# Patient Record
Sex: Female | Born: 1985 | Race: White | Hispanic: No | Marital: Married | Smoking: Never smoker
Health system: Southern US, Community
[De-identification: ages and names within clinical notes are randomized; demographics above are authoritative.]

## PROBLEM LIST (undated history)

## (undated) DIAGNOSIS — D696 Thrombocytopenia, unspecified: Secondary | ICD-10-CM

## (undated) DIAGNOSIS — S060X9A Concussion with loss of consciousness of unspecified duration, initial encounter: Secondary | ICD-10-CM

## (undated) DIAGNOSIS — Z789 Other specified health status: Secondary | ICD-10-CM

## (undated) DIAGNOSIS — S060XAA Concussion with loss of consciousness status unknown, initial encounter: Secondary | ICD-10-CM

## (undated) DIAGNOSIS — F419 Anxiety disorder, unspecified: Secondary | ICD-10-CM

## (undated) DIAGNOSIS — O99119 Other diseases of the blood and blood-forming organs and certain disorders involving the immune mechanism complicating pregnancy, unspecified trimester: Secondary | ICD-10-CM

## (undated) HISTORY — DX: Other specified health status: Z78.9

## (undated) HISTORY — PX: WISDOM TOOTH EXTRACTION: SHX21

---

## 2011-10-19 NOTE — L&D Delivery Note (Signed)
Delivery Note At 10:03 AM a viable and healthy female was delivered via Vaginal, Spontaneous Delivery (Presentation: Left Occiput Anterior).  APGAR: 9, 9; weight 7 lb 5 oz (3317 g).   Placenta status: Intact, Spontaneous.  Cord: 3 vessels with the following complications: None.  Cord pH: na  Anesthesia: Epidural  Episiotomy: na Lacerations: 2nd degree Suture Repair: 2.0 vicryl rapide Est. Blood Loss (mL): 350  Mom to postpartum.  Baby to nursery-stable.  Akili Corsetti J 05/12/2012, 11:49 AM

## 2011-10-29 LAB — OB RESULTS CONSOLE RPR: RPR: NONREACTIVE

## 2011-10-29 LAB — OB RESULTS CONSOLE ANTIBODY SCREEN: Antibody Screen: NEGATIVE

## 2012-04-19 LAB — OB RESULTS CONSOLE GBS: GBS: NEGATIVE

## 2012-05-01 ENCOUNTER — Telehealth (HOSPITAL_COMMUNITY): Payer: Self-pay | Admitting: *Deleted

## 2012-05-01 ENCOUNTER — Encounter (HOSPITAL_COMMUNITY): Payer: Self-pay | Admitting: *Deleted

## 2012-05-01 NOTE — Telephone Encounter (Signed)
Preadmission screen  

## 2012-05-05 ENCOUNTER — Other Ambulatory Visit: Payer: Self-pay | Admitting: Obstetrics and Gynecology

## 2012-05-11 ENCOUNTER — Inpatient Hospital Stay (HOSPITAL_COMMUNITY): Admission: AD | Admit: 2012-05-11 | Payer: 59 | Source: Ambulatory Visit | Admitting: Obstetrics and Gynecology

## 2012-05-11 ENCOUNTER — Inpatient Hospital Stay (HOSPITAL_COMMUNITY)
Admission: RE | Admit: 2012-05-11 | Discharge: 2012-05-13 | DRG: 774 | Disposition: A | Discharge status: Home / Self Care | Payer: 59 | Source: Ambulatory Visit | Attending: Obstetrics and Gynecology | Admitting: Obstetrics and Gynecology

## 2012-05-11 DIAGNOSIS — D689 Coagulation defect, unspecified: Secondary | ICD-10-CM | POA: Diagnosis not present

## 2012-05-11 DIAGNOSIS — O99119 Other diseases of the blood and blood-forming organs and certain disorders involving the immune mechanism complicating pregnancy, unspecified trimester: Secondary | ICD-10-CM | POA: Diagnosis present

## 2012-05-11 DIAGNOSIS — D696 Thrombocytopenia, unspecified: Secondary | ICD-10-CM | POA: Diagnosis present

## 2012-05-11 HISTORY — DX: Other diseases of the blood and blood-forming organs and certain disorders involving the immune mechanism complicating pregnancy, unspecified trimester: O99.119

## 2012-05-11 HISTORY — DX: Thrombocytopenia, unspecified: D69.6

## 2012-05-11 LAB — CBC
Hemoglobin: 11.7 g/dL — ABNORMAL LOW (ref 12.0–15.0)
MCH: 33.1 pg (ref 26.0–34.0)
MCHC: 34.7 g/dL (ref 30.0–36.0)
MCV: 95.2 fL (ref 78.0–100.0)
RBC: 3.54 MIL/uL — ABNORMAL LOW (ref 3.87–5.11)

## 2012-05-11 MED ORDER — LACTATED RINGERS IV SOLN
500.0000 mL | INTRAVENOUS | Status: DC | PRN
  Administered 2012-05-12 (×2): 1000 mL via INTRAVENOUS

## 2012-05-11 MED ORDER — OXYTOCIN 40 UNITS IN LACTATED RINGERS INFUSION - SIMPLE MED
1.0000 m[IU]/min | INTRAVENOUS | Status: DC
  Administered 2012-05-12: 2 m[IU]/min via INTRAVENOUS
  Filled 2012-05-11: qty 1000

## 2012-05-11 MED ORDER — ZOLPIDEM TARTRATE 5 MG PO TABS: 5.0000 mg | ORAL_TABLET | Freq: Every evening | ORAL | Status: DC | PRN

## 2012-05-11 MED ORDER — LIDOCAINE HCL (PF) 1 % IJ SOLN
30.0000 mL | INTRAMUSCULAR | Status: DC | PRN
  Filled 2012-05-11: qty 30

## 2012-05-11 MED ORDER — FLEET ENEMA 7-19 GM/118ML RE ENEM: 1.0000 | ENEMA | RECTAL | Status: DC | PRN

## 2012-05-11 MED ORDER — ONDANSETRON HCL 4 MG/2ML IJ SOLN: 4.0000 mg | Freq: Four times a day (QID) | INTRAMUSCULAR | Status: DC | PRN

## 2012-05-11 MED ORDER — TERBUTALINE SULFATE 1 MG/ML IJ SOLN: 0.2500 mg | Freq: Once | INTRAMUSCULAR | Status: AC | PRN

## 2012-05-11 MED ORDER — MISOPROSTOL 25 MCG QUARTER TABLET
25.0000 ug | ORAL_TABLET | ORAL | Status: DC | PRN
  Administered 2012-05-11 – 2012-05-12 (×2): 25 ug via VAGINAL
  Filled 2012-05-11 (×2): qty 0.25

## 2012-05-11 MED ORDER — ACETAMINOPHEN 325 MG PO TABS
650.0000 mg | ORAL_TABLET | ORAL | Status: DC | PRN
  Administered 2012-05-11: 650 mg via ORAL
  Filled 2012-05-11: qty 2

## 2012-05-11 MED ORDER — OXYTOCIN 40 UNITS IN LACTATED RINGERS INFUSION - SIMPLE MED
62.5000 mL/h | Freq: Once | INTRAVENOUS | Status: AC
  Administered 2012-05-12: 200 mL/h via INTRAVENOUS

## 2012-05-11 MED ORDER — IBUPROFEN 600 MG PO TABS: 600.0000 mg | ORAL_TABLET | Freq: Four times a day (QID) | ORAL | Status: DC | PRN

## 2012-05-11 MED ORDER — OXYCODONE-ACETAMINOPHEN 5-325 MG PO TABS: 1.0000 | ORAL_TABLET | ORAL | Status: DC | PRN

## 2012-05-11 MED ORDER — LACTATED RINGERS IV SOLN
INTRAVENOUS | Status: DC
  Administered 2012-05-12: 04:00:00 via INTRAVENOUS

## 2012-05-11 MED ORDER — OXYTOCIN BOLUS FROM INFUSION
250.0000 mL | Freq: Once | INTRAVENOUS | Status: DC
  Filled 2012-05-11: qty 500

## 2012-05-11 MED ORDER — CITRIC ACID-SODIUM CITRATE 334-500 MG/5ML PO SOLN: 30.0000 mL | ORAL | Status: DC | PRN

## 2012-05-12 ENCOUNTER — Encounter (HOSPITAL_COMMUNITY): Payer: Self-pay | Admitting: Anesthesiology

## 2012-05-12 ENCOUNTER — Inpatient Hospital Stay (HOSPITAL_COMMUNITY): Payer: 59 | Admitting: Anesthesiology

## 2012-05-12 ENCOUNTER — Encounter (HOSPITAL_COMMUNITY): Payer: Self-pay

## 2012-05-12 LAB — RPR: RPR Ser Ql: NONREACTIVE

## 2012-05-12 MED ORDER — DIPHENHYDRAMINE HCL 50 MG/ML IJ SOLN
12.5000 mg | INTRAMUSCULAR | Status: DC | PRN
Start: 2012-05-12 — End: 2012-05-12

## 2012-05-12 MED ORDER — FENTANYL 2.5 MCG/ML BUPIVACAINE 1/10 % EPIDURAL INFUSION (WH - ANES): 14.0000 mL/h | INTRAMUSCULAR | Status: DC

## 2012-05-12 MED ORDER — FENTANYL 2.5 MCG/ML BUPIVACAINE 1/10 % EPIDURAL INFUSION (WH - ANES)
14.0000 mL/h | INTRAMUSCULAR | Status: DC
  Administered 2012-05-12: 14 mL/h via EPIDURAL
  Filled 2012-05-12: qty 60

## 2012-05-12 MED ORDER — SIMETHICONE 80 MG PO CHEW: 80.0000 mg | CHEWABLE_TABLET | ORAL | Status: DC | PRN

## 2012-05-12 MED ORDER — PHENYLEPHRINE 40 MCG/ML (10ML) SYRINGE FOR IV PUSH (FOR BLOOD PRESSURE SUPPORT)
80.0000 ug | PREFILLED_SYRINGE | INTRAVENOUS | Status: DC | PRN
  Filled 2012-05-12: qty 5

## 2012-05-12 MED ORDER — EPHEDRINE 5 MG/ML INJ: 10.0000 mg | INTRAVENOUS | Status: DC | PRN

## 2012-05-12 MED ORDER — DIBUCAINE 1 % RE OINT: 1.0000 "application " | TOPICAL_OINTMENT | RECTAL | Status: DC | PRN

## 2012-05-12 MED ORDER — TETANUS-DIPHTH-ACELL PERTUSSIS 5-2.5-18.5 LF-MCG/0.5 IM SUSP
0.5000 mL | Freq: Once | INTRAMUSCULAR | Status: AC
  Administered 2012-05-13: 0.5 mL via INTRAMUSCULAR
  Filled 2012-05-12: qty 0.5

## 2012-05-12 MED ORDER — LACTATED RINGERS IV SOLN
500.0000 mL | Freq: Once | INTRAVENOUS | Status: DC
Start: 2012-05-12 — End: 2012-05-12

## 2012-05-12 MED ORDER — METHYLERGONOVINE MALEATE 0.2 MG/ML IJ SOLN: 0.2000 mg | INTRAMUSCULAR | Status: DC | PRN

## 2012-05-12 MED ORDER — BENZOCAINE-MENTHOL 20-0.5 % EX AERO
1.0000 "application " | INHALATION_SPRAY | CUTANEOUS | Status: DC | PRN
  Administered 2012-05-12: 1 via TOPICAL
  Filled 2012-05-12: qty 56

## 2012-05-12 MED ORDER — DIPHENHYDRAMINE HCL 25 MG PO CAPS: 25.0000 mg | ORAL_CAPSULE | Freq: Four times a day (QID) | ORAL | Status: DC | PRN

## 2012-05-12 MED ORDER — PHENYLEPHRINE 40 MCG/ML (10ML) SYRINGE FOR IV PUSH (FOR BLOOD PRESSURE SUPPORT): 80.0000 ug | PREFILLED_SYRINGE | INTRAVENOUS | Status: DC | PRN

## 2012-05-12 MED ORDER — ONDANSETRON HCL 4 MG PO TABS: 4.0000 mg | ORAL_TABLET | ORAL | Status: DC | PRN

## 2012-05-12 MED ORDER — PRENATAL MULTIVITAMIN CH
1.0000 | ORAL_TABLET | Freq: Every day | ORAL | Status: DC
  Administered 2012-05-13: 1 via ORAL
  Filled 2012-05-12: qty 1

## 2012-05-12 MED ORDER — ZOLPIDEM TARTRATE 5 MG PO TABS: 5.0000 mg | ORAL_TABLET | Freq: Every evening | ORAL | Status: DC | PRN

## 2012-05-12 MED ORDER — DIPHENHYDRAMINE HCL 50 MG/ML IJ SOLN: 12.5000 mg | INTRAMUSCULAR | Status: DC | PRN

## 2012-05-12 MED ORDER — ONDANSETRON HCL 4 MG/2ML IJ SOLN: 4.0000 mg | INTRAMUSCULAR | Status: DC | PRN

## 2012-05-12 MED ORDER — EPHEDRINE 5 MG/ML INJ
10.0000 mg | INTRAVENOUS | Status: DC | PRN
  Filled 2012-05-12 (×2): qty 4

## 2012-05-12 MED ORDER — LACTATED RINGERS IV SOLN: 500.0000 mL | Freq: Once | INTRAVENOUS | Status: DC

## 2012-05-12 MED ORDER — LIDOCAINE HCL (PF) 1 % IJ SOLN
INTRAMUSCULAR | Status: DC | PRN
  Administered 2012-05-12 (×3): 4 mL

## 2012-05-12 MED ORDER — SENNOSIDES-DOCUSATE SODIUM 8.6-50 MG PO TABS
2.0000 | ORAL_TABLET | Freq: Every day | ORAL | Status: DC
  Administered 2012-05-12: 2 via ORAL

## 2012-05-12 MED ORDER — WITCH HAZEL-GLYCERIN EX PADS: 1.0000 "application " | MEDICATED_PAD | CUTANEOUS | Status: DC | PRN

## 2012-05-12 MED ORDER — OXYCODONE-ACETAMINOPHEN 5-325 MG PO TABS: 1.0000 | ORAL_TABLET | ORAL | Status: DC | PRN

## 2012-05-12 MED ORDER — METHYLERGONOVINE MALEATE 0.2 MG PO TABS: 0.2000 mg | ORAL_TABLET | ORAL | Status: DC | PRN

## 2012-05-12 MED ORDER — IBUPROFEN 600 MG PO TABS
600.0000 mg | ORAL_TABLET | Freq: Four times a day (QID) | ORAL | Status: DC
  Administered 2012-05-12 – 2012-05-13 (×4): 600 mg via ORAL
  Filled 2012-05-12 (×4): qty 1

## 2012-05-12 MED ORDER — LANOLIN HYDROUS EX OINT: TOPICAL_OINTMENT | CUTANEOUS | Status: DC | PRN

## 2012-05-12 NOTE — Anesthesia Preprocedure Evaluation (Signed)
Anesthesia Evaluation  Patient identified by MRN, date of birth, ID band Patient awake    Reviewed: Allergy & Precautions, H&P , NPO status , Patient's Chart, lab work & pertinent test results, reviewed documented beta blocker date and time   History of Anesthesia Complications Negative for: history of anesthetic complications  Airway Mallampati: I TM Distance: >3 FB Neck ROM: full    Dental  (+) Teeth Intact   Pulmonary neg pulmonary ROS,  breath sounds clear to auscultation        Cardiovascular negative cardio ROS  - dysrhythmias Rhythm:regular Rate:Normal     Neuro/Psych negative neurological ROS  negative psych ROS   GI/Hepatic negative GI ROS, Neg liver ROS,   Endo/Other  negative endocrine ROS  Renal/GU negative Renal ROS     Musculoskeletal   Abdominal   Peds  Hematology negative hematology ROS (+)   Anesthesia Other Findings   Reproductive/Obstetrics (+) Pregnancy                           Anesthesia Physical Anesthesia Plan  ASA: II  Anesthesia Plan: Epidural   Post-op Pain Management:    Induction:   Airway Management Planned:   Additional Equipment:   Intra-op Plan:   Post-operative Plan:   Informed Consent: I have reviewed the patients History and Physical, chart, labs and discussed the procedure including the risks, benefits and alternatives for the proposed anesthesia with the patient or authorized representative who has indicated his/her understanding and acceptance.     Plan Discussed with:   Anesthesia Plan Comments:         Anesthesia Quick Evaluation  

## 2012-05-12 NOTE — Progress Notes (Signed)
Tammie Nichols is a 26 y.o. [phone] at [redacted]w[redacted]d by LMP admitted for induction of labor due to history of abruptio.  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

## 2012-05-12 NOTE — Anesthesia Procedure Notes (Signed)
Epidural Patient location during procedure: OB Start time: 05/12/2012 8:05 AM Reason for block: procedure for pain  Staffing Performed by: anesthesiologist   Preanesthetic Checklist Completed: patient identified, site marked, surgical consent, pre-op evaluation, timeout performed, IV checked, risks and benefits discussed and monitors and equipment checked  Epidural Patient position: sitting Prep: site prepped and draped and DuraPrep Patient monitoring: continuous pulse ox and blood pressure Approach: midline Injection technique: LOR air  Needle:  Needle type: Tuohy  Needle gauge: 17 G Needle length: 9 cm Needle insertion depth: 5 cm cm Catheter type: closed end flexible Catheter size: 19 Gauge Catheter at skin depth: 10 cm Test dose: negative  Assessment Events: blood not aspirated, injection not painful, no injection resistance, negative IV test and no paresthesia  Additional Notes Discussed risk of headache, infection, bleeding, nerve injury and failed or incomplete block.  Patient voices understanding and wishes to proceed.

## 2012-05-12 NOTE — H&P (Signed)
Tammie Nichols, [place] NO.:  [id number]  MEDICAL RECORD NO.:  [id number]  LOCATION:  9167                          FACILITY:  [facility]  PHYSICIAN:  Lenoard Aden, M.D.DATE OF BIRTH:  January 22, 1986  DATE OF ADMISSION:  05/11/2012 DATE OF DISCHARGE:                             HISTORY & PHYSICAL   CHIEF COMPLAINT:  History of placental abruption for induction at 39 weeks.  HISTORY OF PRESENT ILLNESS:  She is a 26 year old white female, G4, P1, with a history of a term vaginal delivery x1, history of a 22-week pregnancy complicated by placental abruption and IUFD, who presents now at 39 weeks for induction.  She has no known drug allergies.  MEDICATIONS:  Prenatal vitamins.  FAMILY HISTORY: 1. MS. 2. Hypertension. 3. Anxiety. 4. Breast cancer. 5. Depression. 6. Heart disease.  She is a nonsmoker, nondrinker.  She denies domestic or physical violence.  She has a history of vaginal delivery as noted, history of a 22-week abruption as noted, and a 5-week pregnancy loss.  Prenatal course has been uncomplicated.  Most recent ultrasound performed on May 10, 2012, revealed an appropriate for gestational age fetus with a normal anterior placenta and estimated fetal weight was 7-1/2 pounds.  PHYSICAL EXAMINATION:  GENERAL:  She is a well-developed, well- nourished, white female, in no acute distress. HEENT:  Normal. NECK:  Supple.  Full range of motion. LUNGS:  Clear. HEART:  Regular rate and rhythm. ABDOMEN:  Soft, gravid, nontender.  Estimated fetal weight 7-1/2 pounds. Cervix is 2 cm, thick vertex, -2. EXTREMITIES:  There are no cords. NEUROLOGIC:  Nonfocal. SKIN:  Intact.  IMPRESSION: 1. A 39-week intrauterine pregnancy. 2. History of placental abruption.  PLAN:  Proceed with Cytotec induction.  Cytotec to be placed, pending reactive NST, epidural as needed.  Pitocin in a.m., anticipate attempts at vaginal delivery.     Lenoard Aden,  M.D.     RJT/MEDQ  D:  05/11/2012  T:  05/12/2012  Job:  [id number]

## 2012-05-12 NOTE — Anesthesia Postprocedure Evaluation (Signed)
  Anesthesia Post-op Note  Patient: Tammie Nichols  Procedure(s) Performed: * No procedures listed *  Patient Location: Mother/Baby  Anesthesia Type: Epidural  Level of Consciousness: awake, alert , oriented and patient cooperative  Airway and Oxygen Therapy: Patient Spontanous Breathing  Post-op Pain: mild  Post-op Assessment: Post-op Vital signs reviewed  Post-op Vital Signs: Reviewed and stable  Complications: No apparent anesthesia complications

## 2012-05-13 ENCOUNTER — Encounter (HOSPITAL_COMMUNITY): Payer: Self-pay

## 2012-05-13 DIAGNOSIS — D696 Thrombocytopenia, unspecified: Secondary | ICD-10-CM

## 2012-05-13 HISTORY — DX: Thrombocytopenia, unspecified: D69.6

## 2012-05-13 LAB — CBC
HCT: 33.3 % — ABNORMAL LOW (ref 36.0–46.0)
Hemoglobin: 11.5 g/dL — ABNORMAL LOW (ref 12.0–15.0)
MCH: 33.2 pg (ref 26.0–34.0)
MCV: 96.2 fL (ref 78.0–100.0)
RBC: 3.46 MIL/uL — ABNORMAL LOW (ref 3.87–5.11)

## 2012-05-13 MED ORDER — IBUPROFEN 600 MG PO TABS: 600.0000 mg | ORAL_TABLET | Freq: Four times a day (QID) | ORAL | Status: AC

## 2012-05-13 MED ORDER — BREAST PUMP MISC: 1.0000 [IU] | Status: DC | PRN

## 2012-05-13 NOTE — Progress Notes (Signed)
PPD 1 SVD  S:  Reports feeling well, desires early DC.             Tolerating po/ No nausea or vomiting             Bleeding is light, no clots             Pain controlled with Motrin.             Up ad lib / ambulatory / voiding well.   Newborn  Information for the patient's newborn:  Eller, Sweis [[id number]]  female  breast feeding     O:  A & O x 3 NAD             VS:  Filed Vitals:   05/12/12 1338 05/12/12 1738 05/13/12 0130 05/13/12 0525  BP: 117/61 116/68 106/62 95/60  Pulse: 93 81 70 63  Temp: 98.7 F (37.1 C) 98.6 F (37 C) 98.3 F (36.8 C) 97.5 F (36.4 C)  TempSrc: Oral Oral Oral Oral  Resp: 20 20 20 18   Height:      Weight:      SpO2: 99%  96%     LABS:  Basename 05/13/12 0525 05/11/12 2110  WBC 10.5 9.0  HGB 11.5* 11.7*  HCT 33.3* 33.7*  PLT 104* 118*    Blood type: A/Positive/-- (01/11 0000)  Rubella: Immune (01/11 0000)   I&O: I/O last 3 completed shifts: In: -  Out: 350 [Blood:350]      Lungs: Clear and unlabored  Heart: regular rate and rhythm / no murmurs  Abdomen: soft, non-tender, non-distended              Fundus: firm, non-tender, U -2  Perineum: intact repair, no bruising no edema  Lochia: scant  Extremities: no edema, no calf pain or tenderness, neg Homans    A/P: PPD # 1 26 y.o., [id number]    Principal Problem:  *Postpartum care following vaginal delivery (7/26) Active Problems:  Thrombocytopenia complicating pregnancy  Stable, no s/s PPH, no bruising    Doing well - stable status  Routine post partum orders  DC home w/ bleeding precautions.  Arlan Organ, CNM, MSN 05/13/2012, 10:14 AM

## 2012-05-13 NOTE — Discharge Summary (Signed)
Obstetric Discharge Summary Reason for Admission: induction of labor Prenatal Procedures: ultrasound Intrapartum Procedures: spontaneous vaginal delivery Postpartum Procedures: none Complications-Operative and Postpartum: 2nd degree perineal laceration Hemoglobin  Date Value Range Status  05/13/2012 11.5* 12.0 - 15.0 g/dL Final     HCT  Date Value Range Status  05/13/2012 33.3* 36.0 - 46.0 % Final    Physical Exam:  General: alert, cooperative and no distress Lochia: appropriate Uterine Fundus: firm Incision: healing well DVT Evaluation: Negative Homan's sign. No significant calf/ankle edema.  Discharge Diagnoses: Term Pregnancy-delivered and mild thrombocytopenia  Discharge Information: Date: 05/13/2012 Activity: pelvic rest Diet: routine Medications: PNV and Ibuprofen Condition: stable Instructions: refer to practice specific booklet Discharge to: home Follow-up Information    Follow up with Lenoard Aden, MD. Schedule an appointment as soon as possible for a visit in 6 weeks.   Contact information:   [address] [place] [address] [phone]          Newborn Data: Live born female "Olena Mater" Birth Weight: 7 lb 5 oz (3317 g) APGAR: 9, 9  Home with mother.  Charda Janis 05/13/2012, 10:16 AM

## 2012-05-17 ENCOUNTER — Ambulatory Visit (HOSPITAL_COMMUNITY)
Admission: RE | Admit: 2012-05-17 | Discharge: 2012-05-17 | Disposition: A | Discharge status: Home / Self Care | Payer: 59 | Source: Ambulatory Visit | Attending: Obstetrics and Gynecology | Admitting: Obstetrics and Gynecology

## 2012-05-17 NOTE — Progress Notes (Addendum)
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 Has had to use her DEBP -Medela for relief 2-3X's ( with 2oz off the left , and 1 oz off the right) . Also having challenges with accessory glands in axillary area being full ( left greater than right ). Per mom has improved with heat and pumping. Mom denies any sore nipples.  Frequency of Breastfeeding: 8-10X's in 24 hours , ( approximately every 1-21/2 hours due to the large volume of milk is producing  Length of Feeding: 5 - 10 mins ( per mom my milk lets so quickly she sometimes feeds 2 mins and comes off to take a break). With these short feedings seems very content . Last night she seemed like she was stretching her feeding times.  Voids: >8 Stools: 2-3 seedy yellow stools ( has increased to 4 )   Supplementing / Method: per mom no supplementing  Pumping:  Type of Pump:Medela DEBP -    Frequency:2-3X's   Volume:  upto 2oz , greater yield on the left than right by an 1oz.   Comments: Per mom for engorgement has been using heat , hot showers and then feeding, and feeding often   Today has been feeling some relief .    Consultation Evaluation: 1st obtained  A breastfeeding  history from mom, then Delaney woke up and  Acted hungry. Assessment of breast - both nipples appear healthy pink( no breakdown ,or soreness in appearance, breast full bilaterally ,and left areola semi  compress able due to fullness, right more compress able . Mom easily expressed milk and large am't leakage noted from both breast. Checked the axillary areas for nodules mom had mentioned( right none noted , left small nodule noted ( mom denies tenderness in this area. LC suspect its due to challenges with engorgement and will give mom tx for it.            Delaney awake and rooting. Assisted mom in a comfortable position, mom using her own boppy pillow for support and feeding in the cross cradle position left breast. Mom independent with latch and depth. Delaney sustained latch for 5-7 mins , multiply swallows and gulps of milk noted and then she released.   Initial Feeding Assessment: Pre-feed Weight: 6-14.4 oz ( 3130g)  Post-feed Weight:6-15.3 oz 3156g  Amount Transferred:26 ml plus ( plus .8 for a dirty wet diaper with the feeding)  Comments: Mom has such a quick letdown and great milk flow Delaney released after 5-7 mins and was content laying on the boppy pillow in front of  mom. Mom tried to relatch after 10-15 mins and Delaney did not seem hungry . ( Her last feeding prior to consult was @ 0900 and 0920 per mom.   Additional Feeding Assessment: Did not feed on 2nd breast mom pumped with a hand pump expressing 60 ml from the left breast . Per mom felt much more comfortable.  Pre-feed Weight: Post-feed Weight: Amount Transferred: Comments:  Additional Feeding Assessment: Pre-feed Weight: Post-feed Weight: Amount Transferred: Comments:  Total Breast milk Transferred this Visit: 26 ml plus .8 added on due to weight after changing the wet and stool diaper  Total Supplement Given: none    LC felt assured infant is getting her needs met at the breast . Mom has plenty of milk Delaney just has to get use to the volume  Additional Interventions: LACTATION PLAN OF CARE-1) Keep up the great job breast feeding 2) protect your milk supply ( when Delaney doesn't empty your breast well need to  release the breast by hand expressing or pumping to comfort ( Cautioned mom not to over pump ! ) 3) Goal is to get Delaney to the fatty milk sooner - Breast massage , hand express, prepump if needed to take off the fullness ( therefore Granville Lewis will get to the fat quicker) 1st breast feed 15-20 mins and due to milk supply may not get to the other breast ,release it down. 4) engorgement tx if needed - If breast are .full heading towards firm need to ice 15-20 mins and hand express or pre- pump for the fullness before latch. 5) For tx if swollen accessory glands cold packs or cold cabbage leaves.    Follow-Up- Next Tuesday August 6th at 11am @the  [facility] for weight check                                Per mom Friday August 9th @ [facility] for exam and weight check   Call for Breastfeeding pep talk if needed.,   Kathrin Greathouse 05/17/2012, 11:42 AM

## 2014-08-19 ENCOUNTER — Encounter (HOSPITAL_COMMUNITY): Payer: Self-pay

## 2014-09-22 DIAGNOSIS — M67919 Unspecified disorder of synovium and tendon, unspecified shoulder: Secondary | ICD-10-CM

## 2014-09-22 DIAGNOSIS — IMO0002 Reserved for concepts with insufficient information to code with codable children: Secondary | ICD-10-CM | POA: Insufficient documentation

## 2014-09-22 DIAGNOSIS — M719 Bursopathy, unspecified: Secondary | ICD-10-CM

## 2016-10-09 ENCOUNTER — Encounter: Payer: Self-pay | Admitting: Emergency Medicine

## 2016-10-09 ENCOUNTER — Emergency Department (INDEPENDENT_AMBULATORY_CARE_PROVIDER_SITE_OTHER): Payer: 59

## 2016-10-09 ENCOUNTER — Emergency Department
Admission: EM | Admit: 2016-10-09 | Discharge: 2016-10-09 | Disposition: A | Discharge status: Home / Self Care | Payer: 59 | Source: Home / Self Care | Attending: Family Medicine | Admitting: Family Medicine

## 2016-10-09 DIAGNOSIS — M79671 Pain in right foot: Secondary | ICD-10-CM

## 2016-10-09 DIAGNOSIS — S9031XA Contusion of right foot, initial encounter: Secondary | ICD-10-CM

## 2016-10-09 NOTE — ED Triage Notes (Signed)
Pt states a horse jumped on her right foot yesterday. She is having pain and swelling. Using ice and motrin.

## 2016-10-09 NOTE — ED Provider Notes (Signed)
[facility]    CSN: [id number] Arrival date & time: 10/09/16  0910     History   Chief Complaint Chief Complaint  Patient presents with  . Foot Injury    HPI Tammie Nichols is a 30 y.o. female.   Patient states that her horse stepped on her right foot yesterday twice.  She has pain in her right mid-foot.   The history is provided by the patient.  Foot Injury  Location:  Foot Time since incident:  1 day Injury: yes   Mechanism of injury: crush   Crush:    Approximate weight of object:  1200 pounds Foot location:  R foot Pain details:    Quality:  Aching   Radiates to:  Does not radiate   Severity:  Moderate   Onset quality:  Sudden   Duration:  1 day   Timing:  Constant   Progression:  Unchanged Chronicity:  New Dislocation: no   Prior injury to area:  No Relieved by:  Nothing Worsened by:  Bearing weight Ineffective treatments:  Ice and NSAIDs Associated symptoms: stiffness   Associated symptoms: no decreased ROM, no numbness, no swelling and no tingling     Past Medical History:  Diagnosis Date  . No pertinent past medical history   . Postpartum care following vaginal delivery (7/26) 05/13/2012  . Thrombocytopenia complicating pregnancy (HCC) 05/13/2012    Patient Active Problem List   Diagnosis Date Noted  . Postpartum care following vaginal delivery (7/26) 05/13/2012  . Thrombocytopenia complicating pregnancy (HCC) 05/13/2012    Past Surgical History:  Procedure Laterality Date  . NO PAST SURGERIES      OB History    Gravida Para Term Preterm AB Living   4 3 2 1 1 2    SAB TAB Ectopic Multiple Live Births   1       2       Home Medications    Prior to Admission medications   Medication Sig Start Date End Date Taking? Authorizing Provider  acetaminophen (TYLENOL) 500 MG tablet Take 1,000 mg by mouth every 6 (six) hours as needed. Head ache    Historical Provider, MD  Misc. Devices (BREAST PUMP) MISC 1 Units by Does not  apply route as needed. 05/13/12   Neta Mendsaniela C Paul, CNM  Prenatal Vit-Fe Fumarate-FA (PRENATAL MULTIVITAMIN) TABS Take 1 tablet by mouth at bedtime.    Historical Provider, MD    Family History Family History  Problem Relation Age of Onset  . Hypertension Mother   . Anxiety disorder Father   . Hypertension Father   . Depression Father   . Anxiety disorder Brother   . Depression Brother   . Heart attack Maternal Grandfather   . Heart attack Paternal Grandmother   . Cancer Cousin     breast  . Multiple sclerosis Paternal Aunt     Social History Social History  Substance Use Topics  . Smoking status: Never Smoker  . Smokeless tobacco: Never Used  . Alcohol use No     Allergies   Patient has no known allergies.   Review of Systems Review of Systems  Musculoskeletal: Positive for stiffness.  All other systems reviewed and are negative.    Physical Exam Triage Vital Signs ED Triage Vitals  Enc Vitals Group     BP      Pulse      Resp      Temp      Temp src  SpO2      Weight      Height      Head Circumference      Peak Flow      Pain Score      Pain Loc      Pain Edu?      Excl. in GC?    No data found.   Updated Vital Signs BP 105/73 (BP Location: Right Arm)   Pulse 100   Temp 98 F (36.7 C) (Oral)   Wt 135 lb (61.2 kg)   LMP 10/04/2016   SpO2 98%   BMI 23.17 kg/m   Visual Acuity Right Eye Distance:   Left Eye Distance:   Bilateral Distance:    Right Eye Near:   Left Eye Near:    Bilateral Near:     Physical Exam  Constitutional: She appears well-developed and well-nourished. No distress.  HENT:  Head: Atraumatic.  Eyes: Pupils are equal, round, and reactive to light.  Neck: Normal range of motion.  Cardiovascular: Normal rate.   Pulmonary/Chest: Effort normal.  Musculoskeletal:       Right foot: There is tenderness and bony tenderness. There is normal range of motion, no swelling, normal capillary refill, no crepitus, no  deformity and no laceration.       Feet:  Right foot has tenderness to palpation dorsally as noted on diagram.  Ankle and all toes have full range of motion.  Distal neurovascular function is intact.  There is tenderness to palpation over extensor tendons dorsally.  Neurological: She is alert.  Skin: Skin is warm and dry.  Nursing note and vitals reviewed.    UC Treatments / Results  Labs (all labs ordered are listed, but only abnormal results are displayed) Labs Reviewed - No data to display  EKG  EKG Interpretation None       Radiology Dg Foot Complete Right  Result Date: 10/09/2016 CLINICAL DATA:  Fall and stepped on by horse with right foot pain. EXAM: RIGHT FOOT COMPLETE - 3+ VIEW COMPARISON:  None. FINDINGS: There is no evidence of fracture or dislocation. There is no evidence of arthropathy or other focal bone abnormality. Soft tissues are unremarkable. IMPRESSION: Negative. Electronically Signed   By: Elberta Fortisaniel  Boyle M.D.   On: 10/09/2016 10:23    Procedures Procedures (including critical care time)  Medications Ordered in UC Medications - No data to display   Initial Impression / Assessment and Plan / UC Course  I have reviewed the triage vital signs and the nursing notes.  Pertinent labs & imaging results that were available during my care of the patient were reviewed by me and considered in my medical decision making (see chart for details).  Clinical Course   Ace wrap applied. Apply ice pack for 20 to 30 minutes, 3 to 4 times daily.  Continue until pain and swelling decrease.  Wear ace wrap to decrease swelling daytime.   May take Ibuprofen 200mg , 4 tabs every 8 hours with food. Begin range of motion and stretching exercises in 3 to 5 days as tolerated (follow instructions for foot extensor tendonitis). Followup with Dr. Rodney Langtonhomas Thekkekandam or Dr. Clementeen GrahamEvan Corey ([facility]) if not improving about two weeks.     Final Clinical Impressions(s) / UC  Diagnoses   Final diagnoses:  Contusion of right foot, initial encounter    New Prescriptions New Prescriptions   No medications on file     Lattie HawStephen A Lucella Pommier, MD 10/25/16 1007

## 2016-10-09 NOTE — Discharge Instructions (Signed)
Apply ice pack for 20 to 30 minutes, 3 to 4 times daily.  Continue until pain and swelling decrease.  Wear ace wrap to decrease swelling daytime.   May take Ibuprofen 200mg , 4 tabs every 8 hours with food. Begin range of motion and stretching exercises in 3 to 5 days as tolerated (follow instructions for foot extensor tendonitis).

## 2017-03-25 ENCOUNTER — Encounter: Payer: Self-pay | Admitting: Emergency Medicine

## 2017-03-25 ENCOUNTER — Emergency Department
Admission: EM | Admit: 2017-03-25 | Discharge: 2017-03-25 | Disposition: A | Discharge status: Home / Self Care | Payer: 59 | Source: Home / Self Care | Attending: Family Medicine | Admitting: Family Medicine

## 2017-03-25 DIAGNOSIS — R358 Other polyuria: Secondary | ICD-10-CM

## 2017-03-25 DIAGNOSIS — N3090 Cystitis, unspecified without hematuria: Secondary | ICD-10-CM

## 2017-03-25 DIAGNOSIS — R3589 Other polyuria: Secondary | ICD-10-CM

## 2017-03-25 LAB — POCT URINALYSIS DIP (MANUAL ENTRY)
Bilirubin, UA: NEGATIVE
GLUCOSE UA: NEGATIVE mg/dL
Ketones, POC UA: NEGATIVE mg/dL
Nitrite, UA: NEGATIVE
PROTEIN UA: NEGATIVE mg/dL
Spec Grav, UA: 1.01 (ref 1.010–1.025)
Urobilinogen, UA: 0.2 E.U./dL
pH, UA: 6 (ref 5.0–8.0)

## 2017-03-25 LAB — POCT URINE PREGNANCY: PREG TEST UR: NEGATIVE

## 2017-03-25 MED ORDER — NITROFURANTOIN MONOHYD MACRO 100 MG PO CAPS: 100.0000 mg | ORAL_CAPSULE | Freq: Two times a day (BID) | ORAL | 0 refills | Status: AC

## 2017-03-25 NOTE — Discharge Instructions (Signed)
Increase fluid intake. May use non-prescription AZO for about two days, if desired, to decrease urinary discomfort.  If symptoms become significantly worse during the night or over the weekend, proceed to the local emergency room.  

## 2017-03-25 NOTE — ED Provider Notes (Signed)
[facility]    CSN: [id number] Arrival date & time: 03/25/17  0933     History   Chief Complaint Chief Complaint  Patient presents with  . Hematuria    HPI Tammie Nichols is a 31 y.o. female.   Patient complains of onset of dysuria, urgency, frequency, and nocturia five days ago.  She has had chills but no fever.  Patient's last menstrual period was 03/07/2017 (exact date).    The history is provided by the patient.  Hematuria  This is a new problem. Episode onset: 5 days ago. The problem occurs constantly. The problem has not changed since onset.Pertinent negatives include no abdominal pain. Nothing aggravates the symptoms. Nothing relieves the symptoms. She has tried nothing for the symptoms.    Past Medical History:  Diagnosis Date  . No pertinent past medical history   . Postpartum care following vaginal delivery (7/26) 05/13/2012  . Thrombocytopenia complicating pregnancy (HCC) 05/13/2012    Patient Active Problem List   Diagnosis Date Noted  . Postpartum care following vaginal delivery (7/26) 05/13/2012  . Thrombocytopenia complicating pregnancy (HCC) 05/13/2012    Past Surgical History:  Procedure Laterality Date  . NO PAST SURGERIES      OB History    Gravida Para Term Preterm AB Living   4 3 2 1 1 2    SAB TAB Ectopic Multiple Live Births   1       2       Home Medications    Prior to Admission medications   Medication Sig Start Date End Date Taking? Authorizing Provider  Cranberry-Vit C-Lactobacillus (RA CRANBERRY SUPPLEMENTS) 450-30 MG TABS Take by mouth.   Yes [provider]  acetaminophen (TYLENOL) 500 MG tablet Take 1,000 mg by mouth every 6 (six) hours as needed. Head ache    [provider]  nitrofurantoin, macrocrystal-monohydrate, (MACROBID) 100 MG capsule Take 1 capsule (100 mg total) by mouth 2 (two) times daily. Take with food. 03/25/17   Lattie Haw, MD    Family History Family History  Problem  Relation Age of Onset  . Hypertension Mother   . Anxiety disorder Father   . Hypertension Father   . Depression Father   . Anxiety disorder Brother   . Depression Brother   . Heart attack Maternal Grandfather   . Heart attack Paternal Grandmother   . Cancer Cousin        breast  . Multiple sclerosis Paternal Aunt     Social History Social History  Substance Use Topics  . Smoking status: Never Smoker  . Smokeless tobacco: Never Used  . Alcohol use Yes     Allergies   Patient has no known allergies.   Review of Systems Review of Systems  Gastrointestinal: Negative for abdominal pain.  Genitourinary: Positive for hematuria.  All other systems reviewed and are negative.    Physical Exam Triage Vital Signs ED Triage Vitals [03/25/17 0959]  Enc Vitals Group     BP 109/68     Pulse Rate 86     Resp      Temp 98.8 F (37.1 C)     Temp Source Oral     SpO2 100 %     Weight 132 lb (59.9 kg)     Height 5\' 3"  (1.6 m)     Head Circumference      Peak Flow      Pain Score 4     Pain Loc  Pain Edu?      Excl. in GC?    No data found.   Updated Vital Signs BP 109/68 (BP Location: Left Arm)   Pulse 86   Temp 98.8 F (37.1 C) (Oral)   Ht 5\' 3"  (1.6 m)   Wt 132 lb (59.9 kg)   LMP 03/07/2017 (Exact Date)   SpO2 100%   BMI 23.38 kg/m   Visual Acuity Right Eye Distance:   Left Eye Distance:   Bilateral Distance:    Right Eye Near:   Left Eye Near:    Bilateral Near:     Physical Exam Nursing notes and Vital Signs reviewed. Appearance:  Patient appears stated age, and in no acute distress.    Eyes:  Pupils are equal, round, and reactive to light and accomodation.  Extraocular movement is intact.  Conjunctivae are not inflamed   Pharynx:  Normal; moist mucous membranes  Neck:  Supple.  No adenopathy Lungs:  Clear to auscultation.  Breath sounds are equal.  Moving air well. Heart:  Regular rate and rhythm without murmurs, rubs, or gallops.  Abdomen:   Nontender without masses or hepatosplenomegaly.  Bowel sounds are present.  No CVA or flank tenderness.  Extremities:  No edema.  Skin:  No rash present.     UC Treatments / Results  Labs (all labs ordered are listed, but only abnormal results are displayed) Labs Reviewed  POCT URINALYSIS DIP (MANUAL ENTRY) - Abnormal; Notable for the following:       Result Value   Clarity, UA cloudy (*)    Blood, UA small (*)    Leukocytes, UA Large (3+) (*)    All other components within normal limits  URINE CULTURE  POCT URINE PREGNANCY    EKG  EKG Interpretation None       Radiology No results found.  Procedures Procedures (including critical care time)  Medications Ordered in UC Medications - No data to display   Initial Impression / Assessment and Plan / UC Course  I have reviewed the triage vital signs and the nursing notes.  Pertinent labs & imaging results that were available during my care of the patient were reviewed by me and considered in my medical decision making (see chart for details).    Urine culture pending. Begin Macrobid 100mg  BID for one week. Increase fluid intake. May use non-prescription AZO for about two days, if desired, to decrease urinary discomfort.  If symptoms become significantly worse during the night or over the weekend, proceed to the local emergency room.  Followup with Family Doctor if not improved in one week.     Final Clinical Impressions(s) / UC Diagnoses   Final diagnoses:  Polyuria  Cystitis    New Prescriptions Discharge Medication List as of 03/25/2017 10:23 AM    START taking these medications   Details  nitrofurantoin, macrocrystal-monohydrate, (MACROBID) 100 MG capsule Take 1 capsule (100 mg total) by mouth 2 (two) times daily. Take with food., Starting Fri 03/25/2017, Normal         Lattie HawBeese, Stephen A, MD 03/25/17 [phone]

## 2017-03-25 NOTE — ED Triage Notes (Signed)
Hematuria, urgency, lower abdominal pain, fever polyuria x 5 days

## 2017-03-27 LAB — URINE CULTURE

## 2017-03-28 ENCOUNTER — Telehealth: Payer: Self-pay | Admitting: Emergency Medicine

## 2017-12-21 DIAGNOSIS — M5416 Radiculopathy, lumbar region: Secondary | ICD-10-CM | POA: Insufficient documentation

## 2018-06-21 ENCOUNTER — Emergency Department (HOSPITAL_COMMUNITY): Payer: 59

## 2018-06-21 ENCOUNTER — Encounter (HOSPITAL_COMMUNITY): Payer: Self-pay | Admitting: Emergency Medicine

## 2018-06-21 ENCOUNTER — Emergency Department (HOSPITAL_COMMUNITY)
Admission: EM | Admit: 2018-06-21 | Discharge: 2018-06-21 | Disposition: A | Discharge status: Home / Self Care | Payer: 59 | Attending: Emergency Medicine | Admitting: Emergency Medicine

## 2018-06-21 DIAGNOSIS — R55 Syncope and collapse: Secondary | ICD-10-CM | POA: Insufficient documentation

## 2018-06-21 DIAGNOSIS — F419 Anxiety disorder, unspecified: Secondary | ICD-10-CM | POA: Diagnosis not present

## 2018-06-21 DIAGNOSIS — Z79899 Other long term (current) drug therapy: Secondary | ICD-10-CM | POA: Insufficient documentation

## 2018-06-21 DIAGNOSIS — R002 Palpitations: Secondary | ICD-10-CM | POA: Insufficient documentation

## 2018-06-21 HISTORY — DX: Anxiety disorder, unspecified: F41.9

## 2018-06-21 LAB — BASIC METABOLIC PANEL
ANION GAP: 10 (ref 5–15)
BUN: 13 mg/dL (ref 6–20)
CHLORIDE: 104 mmol/L (ref 98–111)
CO2: 27 mmol/L (ref 22–32)
Calcium: 10 mg/dL (ref 8.9–10.3)
Creatinine, Ser: 0.78 mg/dL (ref 0.44–1.00)
GFR calc non Af Amer: >60 mL/min (ref >60)
Glucose, Bld: 99 mg/dL (ref 70–99)
Potassium: 3.9 mmol/L (ref 3.5–5.1)
Sodium: 141 mmol/L (ref 135–145)

## 2018-06-21 LAB — CBC
HEMATOCRIT: 40.5 % (ref 36.0–46.0)
Hemoglobin: 14.1 g/dL (ref 12.0–15.0)
MCH: 33.1 pg (ref 26.0–34.0)
MCHC: 34.8 g/dL (ref 30.0–36.0)
MCV: 95.1 fL (ref 78.0–100.0)
Platelets: 210 10*3/uL (ref 150–400)
RBC: 4.26 MIL/uL (ref 3.87–5.11)
RDW: 12 % (ref 11.5–15.5)
WBC: 8.7 10*3/uL (ref 4.0–10.5)

## 2018-06-21 LAB — I-STAT BETA HCG BLOOD, ED (MC, WL, AP ONLY): I-stat hCG, quantitative: <5 m[IU]/mL (ref <5)

## 2018-06-21 LAB — I-STAT TROPONIN, ED: Troponin i, poc: 0.01 ng/mL (ref 0.00–0.08)

## 2018-06-21 LAB — POCT I-STAT TROPONIN I: Troponin i, poc: 0.02 ng/mL (ref 0.00–0.08)

## 2018-06-21 MED ORDER — SODIUM CHLORIDE 0.9 % IV BOLUS
1000.0000 mL | Freq: Once | INTRAVENOUS | Status: AC
  Administered 2018-06-21: 1000 mL via INTRAVENOUS

## 2018-06-21 NOTE — ED Provider Notes (Signed)
[facility]-EMERGENCY DEPT Provider Note   CSN: [id number] Arrival date & time: 06/21/18  1443     History   Chief Complaint Chief Complaint  Patient presents with  . Palpitations  . Dizziness    HPI Tammie Nichols is a 32 y.o. female presenting for lightheadedness and palpitations. Patient states that she went on for her daily run today around lunchtime when she began feeling lightheaded, like she is going to pass out, as well as palpitations.  She states that this lasted for approximately 2 minutes.  Patient states that her heart rate at that time was in the 160s which is higher than usual for her.  Patient states that she sat down and rested, drank water and began feeling back to normal however when she returned to work after her run her heart rate was in the 80s which again she states is higher than it normally is for her.  Patient states that she is an otherwise healthy individual, denies history of heart disease or arrhythmias, denies similar episodes in the past, denies history of blood clots.  Patient denies history of sudden cardiac death in her family below the age of 60.  Patient denies history of recent illness, nausea/vomiting or diarrhea.  Patient states that she is feeling well now, denies palpitations or lightheadedness or any other new or concerning symptoms at this time.  HPI  Past Medical History:  Diagnosis Date  . Anxiety   . No pertinent past medical history   . Postpartum care following vaginal delivery (7/26) 05/13/2012  . Thrombocytopenia complicating pregnancy (HCC) 05/13/2012    Patient Active Problem List   Diagnosis Date Noted  . Postpartum care following vaginal delivery (7/26) 05/13/2012  . Thrombocytopenia complicating pregnancy (HCC) 05/13/2012    Past Surgical History:  Procedure Laterality Date  . NO PAST SURGERIES       OB History    Gravida  4   Para  3   Term  2   Preterm  1   AB  1   Living  2     SAB  1   TAB      Ectopic      Multiple      Live Births  2            Home Medications    Prior to Admission medications   Medication Sig Start Date End Date Taking? Authorizing Provider  ibuprofen (ADVIL,MOTRIN) 200 MG tablet Take 800 mg by mouth at bedtime as needed. Back pain   Yes [provider]  Multiple Vitamin (MULTIVITAMIN WITH MINERALS) TABS tablet Take 1 tablet by mouth daily.   Yes [provider]  sertraline (ZOLOFT) 100 MG tablet Take 100 mg by mouth daily. 06/11/18  Yes [provider]  nitrofurantoin, macrocrystal-monohydrate, (MACROBID) 100 MG capsule Take 1 capsule (100 mg total) by mouth 2 (two) times daily. Take with food. Patient not taking: Reported on 06/21/2018 03/25/17   Lattie Haw, MD    Family History Family History  Problem Relation Age of Onset  . Hypertension Mother   . Anxiety disorder Father   . Hypertension Father   . Depression Father   . Anxiety disorder Brother   . Depression Brother   . Heart attack Maternal Grandfather   . Heart attack Paternal Grandmother   . Cancer Cousin        breast  . Multiple sclerosis Paternal Aunt     Social History Social History  Tobacco Use  . Smoking status: Never Smoker  . Smokeless tobacco: Never Used  Substance Use Topics  . Alcohol use: Yes  . Drug use: No     Allergies   Patient has no known allergies.   Review of Systems Review of Systems  Constitutional: Negative.  Negative for chills and fever.  HENT: Negative.  Negative for rhinorrhea and sore throat.   Eyes: Negative.  Negative for visual disturbance.  Respiratory: Negative.  Negative for cough and shortness of breath.   Cardiovascular: Positive for palpitations. Negative for chest pain.  Gastrointestinal: Negative.  Negative for abdominal pain, blood in stool, diarrhea, nausea and vomiting.  Genitourinary: Negative.  Negative for dysuria and hematuria.  Musculoskeletal: Negative.  Negative  for arthralgias and myalgias.  Skin: Negative.  Negative for rash.  Neurological: Positive for light-headedness. Negative for dizziness, syncope, weakness and headaches.     Physical Exam Updated Vital Signs BP 112/65   Pulse 71   Temp 98.2 F (36.8 C) (Oral)   Resp (!) 23   Ht 5\' 3"  (1.6 m)   LMP 06/07/2018   SpO2 98%   BMI 23.38 kg/m   Physical Exam  Constitutional: She is oriented to person, place, and time. She appears well-developed and well-nourished. No distress.  HENT:  Head: Normocephalic and atraumatic.  Right Ear: External ear normal.  Left Ear: External ear normal.  Nose: Nose normal.  Eyes: Pupils are equal, round, and reactive to light. Conjunctivae and EOM are normal.  Neck: Trachea normal and normal range of motion. No tracheal deviation present.  Cardiovascular: Normal rate, regular rhythm, normal heart sounds, intact distal pulses and normal pulses.  Pulses:      Radial pulses are 2+ on the right side, and 2+ on the left side.       Dorsalis pedis pulses are 2+ on the right side, and 2+ on the left side.       Posterior tibial pulses are 2+ on the right side, and 2+ on the left side.  Pulmonary/Chest: Effort normal and breath sounds normal. No respiratory distress.  Abdominal: Soft. There is no tenderness. There is no rebound and no guarding.  Musculoskeletal: Normal range of motion. She exhibits no edema, tenderness or deformity.  Neurological: She is alert and oriented to person, place, and time.  Skin: Skin is warm and dry. Capillary refill takes less than 2 seconds.  Psychiatric: She has a normal mood and affect. Her behavior is normal.     ED Treatments / Results  Labs (all labs ordered are listed, but only abnormal results are displayed) Labs Reviewed  BASIC METABOLIC PANEL  CBC  I-STAT TROPONIN, ED  I-STAT BETA HCG BLOOD, ED (MC, [facility], AP ONLY)  I-STAT TROPONIN, ED  POCT I-STAT TROPONIN I    EKG EKG Interpretation  Date/Time:  Wednesday  June 21 2018 14:52:34 EDT Ventricular Rate:  85 PR Interval:    QRS Duration: 95 QT Interval:  348 QTC Calculation: 414 R Axis:   63 Text Interpretation:  Sinus rhythm Short PR interval No previous tracing Confirmed by Cathren Laine ([id number]) on 06/21/2018 7:20:45 PM   Radiology Dg Chest 2 View  Result Date: 06/21/2018 CLINICAL DATA:  Dizziness and lightheadedness. EXAM: CHEST - 2 VIEW COMPARISON:  None. FINDINGS: The heart size and mediastinal contours are within normal limits. Both lungs are clear. The visualized skeletal structures are unremarkable. IMPRESSION: Normal chest. Electronically Signed   By: Obie Dredge M.D.   On: 06/21/2018 15:56  Procedures Procedures (including critical care time)  Medications Ordered in ED Medications  sodium chloride 0.9 % bolus 1,000 mL (0 mLs Intravenous Stopped 06/21/18 2110)     Initial Impression / Assessment and Plan / ED Course  I have reviewed the triage vital signs and the nursing notes.  Pertinent labs & imaging results that were available during my care of the patient were reviewed by me and considered in my medical decision making (see chart for details).    Patient presenting for palpitations and near syncopal episode that occurred today around lunchtime. Patient on cardiac monitor while in department. I-STAT beta hCG negative. BMP within normal limits. CBC within normal limits. Troponin within normal limits x2. Chest x-ray negative. EKG reviewed by Dr. Denton Lank without arrythmia. Fluid bolus given in department. Orthostatic vital signs negative.  Patient well-appearing, no acute distress.  Patient denies recurrence of lightheadedness or palpitations while emergency department.  Patient given referral to cardiology for further evaluation regarding her palpitations.  Patient informed to return to the emergency department for any new or worsening or return of symptoms.  Patient denies history of heart disease.  Patient denies  family history of sudden cardiac death under the age of 72.  At this time there does not appear to be any evidence of an acute emergency medical condition and the patient appears stable for discharge with appropriate outpatient follow up. Diagnosis was discussed with patient who verbalizes understanding of care plan and is agreeable to discharge. I have discussed return precautions with patient and who verbalizes understanding of return precautions. Patient strongly encouraged to follow-up with their PCP. All questions answered.  Patient's case discussed with and patient seen and evaluated by Dr. Denton Lank who agrees with discharge and outpatient follow-up at this time.    Note: Portions of this report may have been transcribed using voice recognition software. Every effort was made to ensure accuracy; however, inadvertent computerized transcription errors may still be present.    Final Clinical Impressions(s) / ED Diagnoses   Final diagnoses:  Heart palpitations  Near syncope    ED Discharge Orders    None       Elizabeth Palau 06/22/18 0106    Cathren Laine, MD 06/22/18 [phone]

## 2018-06-21 NOTE — ED Notes (Signed)
Pt denies dizziness with standing or changing positions during orthostatic vitals

## 2018-06-21 NOTE — ED Triage Notes (Signed)
Pt reports that today after her run felt dizzy and lightheaded and couldn't get her heart rate down to her normal resting HR.  Pt reports normal resting HR in low 60s and hasnt gotten below 80s.

## 2018-06-21 NOTE — Discharge Instructions (Addendum)
Please return to the Emergency Department for any new or worsening symptoms or if your symptoms do not improve. Please be sure to follow up with your Primary Care Physician as soon as possible regarding your visit today. If you do not have a Primary Doctor please use the resources below to establish one. You may call [facility] to schedule a follow-up appointment regarding your symptoms today.  Please return to the emergency department as soon as possible if your symptoms return or if you lose consciousness.  Contact a health care provider if: You continue to have a fast or irregular heartbeat after 24 hours. Your palpitations occur more often. Get help right away if: You have chest pain or shortness of breath. You have a severe headache. You feel dizzy or you faint. Get help right away if: You have a severe headache. You have unusual pain in your chest, abdomen, or back. You are bleeding from your mouth or rectum, or you have black or tarry stool. You have a very fast or irregular heartbeat (palpitations). You faint once or repeatedly. You have a seizure. You are confused. You have trouble walking. You have severe weakness. You have vision problems.  RESOURCE GUIDE  Chronic Pain Problems: Contact [facility]  [phone] Patients need to be referred by their primary care doctor.  Insufficient Money for Medicine: Contact [employer]:  call "211" or [facility] [phone].  No Primary Care Doctor: Call [facility]  [phone] - can help you locate a primary care doctor that  accepts your insurance, provides certain services, etc. Physician Referral Service- [phone]  Agencies that provide inexpensive medical care: [facility]  [phone] [facility]  [phone] [facility]  [phone] [facility]  [phone] Planned Parenthood  [phone] [facility]   [phone]  Medicaid-accepting [facility] Providers: [facility]- [address], [address]  [phone], Mon-Fri 9am-7pm, Sat 9am-1pm [facility]- [address] [place], Suite [place]  [phone] [facility]- [address], Suite [place]  [phone] [facility]- [address]  [phone] Renaye Rakers- [address] [place], [address], [phone]  Only accepts [place] patients after they have their name  applied to their card  Self Pay (no insurance) in [facility]: Sickle Cell Patients: Dr Willey Blade, [facility]  [address] [place], [phone] [facility]- [address] [place]  [phone]       [facility] [place]- [address]       -     [facility]- see information above (Speak to [employer] if you do not have insurance)       -  [facility]- [address] [place], [phone]       -  [facility]- [address],  [phone]       -  [facility]- [address], [phone]       -  Dr Julio Sicks-  [address], [address], [place], [phone]       -  [facility]- [address], [phone]       -  [facility]- [address], [phone], also [address] [phone]       -  [place], [place], 1st & 3rd Saturday   every month, 10am-1pm  1) Find a Doctor and Pay Out of Pocket Although you won't have to find out who is covered by your insurance plan, it is a good idea to ask around and get recommendations. You will then need to call the office and see if the doctor you have chosen will accept you as a new patient and what types of options they offer for patients who are self-pay. Some doctors offer discounts or will set up payment plans for their patients who do not have insurance, but you will need to ask so you aren't surprised when  you get to your appointment.  2) Contact Your Local Health Department Not all health departments have doctors that can see patients for sick visits, but many do, so it is worth a call to see if yours does. If you don't know where your local health department is, you can check in your phone book. The CDC also has a tool to help you locate your state's health department, and many state websites also have listings of all of their local health departments.  3) Find a [facility] If your illness is not likely to be very severe or complicated, you may want to try a walk in clinic. These are popping up all over the country in pharmacies, drugstores, and shopping centers. They're usually staffed by nurse practitioners or physician assistants that have been trained to treat common illnesses and complaints. They're usually fairly quick and inexpensive. However, if you have serious medical issues or chronic medical problems, these are probably not your best option  [facility] [place], [facility], [address], [place], phone [phone] or [phone].  Monday - Friday, call for an appointment. [place], [facility], [address], [place], phone [phone] or [phone].  Monday - Friday, call for an appointment.  Abuse/Neglect: [place] [phone] [place] [phone] (After Hours)  Emergency Shelter:  [facility] [phone]  Maternity Homes: [facility] [place] [phone] [place] [phone]  MRSA Hotline #:   (772)373-1098  [facility] Dept. [address]         [place] Phone:  [phone]                                  Phone:  [phone]                   Phone:  [place], [phone]       -  [facility] in [place], [address]                                  [place] [phone] or [phone] (After Hours)   [place]  Substance Abuse Resources: Alcohol and Drug Services  [place] [phone] [facility] [place] Chinita Pester [phone] Residential & Outpatient Substance Abuse Program  [phone]  Psychological Services: [place]  [phone] [place]  [place], [place] [address] [place], [place]: [phone] or [phone], [employer]  Dental Assistance  If unable to pay or uninsured, contact:  [facility] or [facility]. to become qualified for the adult dental clinic.  Patients with Medicaid: [facility] [phone] [address], [place] [address], [phone]  If unable to pay, or uninsured, contact [facility] (507)[phone]) or [place] [phone] in [place], [address] [place] in [facility]) to become qualified for the adult dental clinic   Other [place]- [place], [place], [place], [address], [place], [place], 2nd and 4th Thursday of the month at 6:30am.  10 clients each day by appointment, can sometimes see walk-in patients if someone does not show for an appointment. [facility]- [address] Hillard Danker [place], [place], [address], [place], [place], [place], [address],  [place] Department- [phone] [facility] [phone]

## 2018-06-28 ENCOUNTER — Encounter: Payer: Self-pay | Admitting: Cardiology

## 2018-06-28 ENCOUNTER — Ambulatory Visit: Payer: 59 | Admitting: Cardiology

## 2018-06-28 VITALS — BP 112/62 | HR 62 | Ht 63.0 in | Wt 127.0 lb

## 2018-06-28 DIAGNOSIS — R079 Chest pain, unspecified: Secondary | ICD-10-CM

## 2018-06-28 DIAGNOSIS — R42 Dizziness and giddiness: Secondary | ICD-10-CM | POA: Diagnosis not present

## 2018-06-28 NOTE — Patient Instructions (Addendum)
Medication Instructions:  Your physician recommends that you continue on your current medications as directed. Please refer to the Current Medication list given to you today.  Labwork: Your physician recommends that you have the following labs drawn: Mag, TSH, and lipid panel.  Testing/Procedures: Your physician has requested that you have an echocardiogram. Echocardiography is a painless test that uses sound waves to create images of your heart. It provides your doctor with information about the size and shape of your heart and how well your heart's chambers and valves are working. This procedure takes approximately one hour. There are no restrictions for this procedure.  Your physician has requested that you have an exercise tolerance test. For further information please visit [email or web address] Please also follow instruction sheet, as given.  Your physician has recommended that you wear an event monitor. Event monitors are medical devices that record the heart's electrical activity. Doctors most often [place] these monitors to diagnose arrhythmias. Arrhythmias are problems with the speed or rhythm of the heartbeat. The monitor is a small, portable device. You can wear one while you do your normal daily activities. This is usually used to diagnose what is causing palpitations/syncope (passing out).  Follow-Up: Your physician recommends that you schedule a follow-up appointment in: 6 months  Any Other Special Instructions Will Be Listed Below (If Applicable).     If you need a refill on your cardiac medications before your next appointment, please call your pharmacy.   [facility]  Garey Ham, RN, BSN   Holter Monitoring A Holter monitor is a small device that is used to detect abnormal heart rhythms. It clips to your clothing and is connected by wires to flat, sticky disks (electrodes) that attach to your chest. It is worn continuously for 24-48 hours. Follow these instructions at  home:  Wear your Holter monitor at all times, even while exercising and sleeping, for as long as directed by your health care provider.  Make sure that the Holter monitor is safely clipped to your clothing or close to your body as recommended by your health care provider.  Do not get the monitor or wires wet.  Do not put body lotion or moisturizer on your chest.  Keep your skin clean.  Keep a diary of your daily activities, such as walking and doing chores. If you feel that your heartbeat is abnormal or that your heart is fluttering or skipping a beat: ? Record what you are doing when it happens. ? Record what time of day the symptoms occur.  Return your Holter monitor as directed by your health care provider.  Keep all follow-up visits as directed by your health care provider. This is important. Get help right away if:  You feel lightheaded or you faint.  You have trouble breathing.  You feel pain in your chest, upper arm, or jaw.  You feel sick to your stomach and your skin is pale, cool, or damp.  You heartbeat feels unusual or abnormal. This information is not intended to replace advice given to you by your health care provider. Make sure you discuss any questions you have with your health care provider. Document Released: 07/02/2004 Document Revised: 03/11/2016 Document Reviewed: 05/13/2014 Elsevier Interactive Patient Education  2018 [employer].  Cardiopulmonary Exercise Stress Test Cardiopulmonary exercise testing (CPET) is a test that checks how your heart and lungs react to exercise. This is called your exercise capacity. During this test, you will walk or run on a treadmill or pedal on a  stationary bike while tests are done on your heart and lungs. You may have this test to:  See why you are short of breath.  Check for exercise intolerance.  See how your lungs work.  See how your heart works.  Check for how you are responding to a heart or lung rehabilitation  program.  See if you have a heart or lung problem.  See if you are healthy enough to have surgery.  What happens before the procedure?  Follow instructions from your doctor about what you cannot eat or drink.  Ask your doctor about changing or stopping your normal medicines. This is important if you take diabetes medicines or blood thinners.  Wear loose, comfortable clothing and shoes.  If you use an inhaler, bring it with you to the test. What happens during the procedure?  A blood pressure cuff will be placed on your arm.  Several stick-on patches (electrodes) will be placed on your chest. They will be attached to an electrocardiogram (EKG) machine.  A clip-on monitor that measures the amount of oxygen in your blood will be placed on your finger (pulse oximeter).  A clip will be placed on your nose and a mouthpiece will be placed in your mouth. This may be held in place with a headpiece. You will breathe through the mouthpiece during the test.  You will be asked to start exercising. You will be closely watched while you exercise.  The amount of effort for your exercise will be gradually increased.  During exercise, the test will measure: ? Your heart rate. ? Your heart rhythm. ? Your oxygen blood level. ? The amount of oxygen and carbon dioxide that you breathe out.  The test will end when: ? You have finished the test. ? You have reached your maximum ability to exercise. ? You have chest or leg pain, dizziness, or shortness of breath. The procedure may vary among doctors and hospitals. What happens after the procedure?  Your blood pressure and EKG will be checked to watch how you recover from the test. This information is not intended to replace advice given to you by your health care provider. Make sure you discuss any questions you have with your health care provider. Document Released: 09/22/2009 Document Revised: 02/24/2016 Document Reviewed: 08/18/2015 Elsevier  Interactive Patient Education  2018 [employer]. Echocardiogram An echocardiogram, or echocardiography, uses sound waves (ultrasound) to produce an image of your heart. The echocardiogram is simple, painless, obtained within a short period of time, and offers valuable information to your health care provider. The images from an echocardiogram can provide information such as:  Evidence of coronary artery disease (CAD).  Heart size.  Heart muscle function.  Heart valve function.  Aneurysm detection.  Evidence of a past heart attack.  Fluid buildup around the heart.  Heart muscle thickening.  Assess heart valve function.  Tell a health care provider about:  Any allergies you have.  All medicines you are taking, including vitamins, herbs, eye drops, creams, and over-the-counter medicines.  Any problems you or family members have had with anesthetic medicines.  Any blood disorders you have.  Any surgeries you have had.  Any medical conditions you have.  Whether you are pregnant or may be pregnant. What happens before the procedure? No special preparation is needed. Eat and drink normally. What happens during the procedure?  In order to produce an image of your heart, gel will be applied to your chest and a wand-like tool (transducer) will be  moved over your chest. The gel will help transmit the sound waves from the transducer. The sound waves will harmlessly bounce off your heart to allow the heart images to be captured in real-time motion. These images will then be recorded.  You may need an IV to receive a medicine that improves the quality of the pictures. What happens after the procedure? You may return to your normal schedule including diet, activities, and medicines, unless your health care provider tells you otherwise. This information is not intended to replace advice given to you by your health care provider. Make sure you discuss any questions you have with your  health care provider. Document Released: 10/01/2000 Document Revised: 05/22/2016 Document Reviewed: 06/11/2013 Elsevier Interactive Patient Education  2017 [employer].

## 2018-06-28 NOTE — Progress Notes (Signed)
Cardiology Office Note:    Date:  06/28/2018   ID:  Tammie Nichols, DOB 1985/12/30, MRN [id number]  PCP:  Patient, No Pcp Per  Cardiologist:  Garwin Brothers, MD   Referring MD: No ref. provider found    ASSESSMENT:    1. Dizziness    PLAN:    In order of problems listed above:  1. Primary prevention stressed with the patient.  Importance of compliance with diet and medication stressed and she vocalized understanding.  Interestingly she was well-hydrated before and after these events happened. 2. In view of the above we will do blood work including magnesium levels.  I will also do a treadmill stress test in view of her chest discomfort issues.  She is at low risk for coronary events but in view of strong family history and to reassure her I will do this test. 3. Echocardiogram will be done to assess murmur heard on auscultation.  In view of her palpitations I will do a one-month event monitoring. 4. Patient will be seen in follow-up appointment in 6 months or earlier if the patient has any concerns 5. She was advised to go to the nearest emergency room for any concerning symptoms.  She was advised not to take any significant long runs until the above is and also if she is cleared by her primary care physician.   Medication Adjustments/Labs and Tests Ordered: Current medicines are reviewed at length with the patient today.  Concerns regarding medicines are outlined above.  No orders of the defined types were placed in this encounter.  No orders of the defined types were placed in this encounter.    History of Present Illness:    Tammie Nichols is a 32 y.o. female who is being seen today for the evaluation of palpitations and dizziness..  Patient is a healthy lady.  She is very active and exercises and runs a few miles a couple of times a week without any problems.  On one occasion she tells me that she felt lightheaded and did not pass out.  She felt like a tunnel vision and  fogginess which is why she went to the emergency room.  In the emergency room she was evaluated and released.  Subsequently she is done fine and has not run.  No chest pain orthopnea PND or any syncopal spells.  At the time of my evaluation, the patient is alert awake oriented and in no distress.  She mentions to me that occasionally she will have chest discomfort not related to exertion.  Past Medical History:  Diagnosis Date  . Anxiety   . No pertinent past medical history   . Postpartum care following vaginal delivery (7/26) 05/13/2012  . Thrombocytopenia complicating pregnancy (HCC) 05/13/2012    Past Surgical History:  Procedure Laterality Date  . WISDOM TOOTH EXTRACTION      Current Medications: Current Meds  Medication Sig  . ibuprofen (ADVIL,MOTRIN) 200 MG tablet Take 800 mg by mouth at bedtime as needed. Back pain  . Multiple Vitamin (MULTIVITAMIN WITH MINERALS) TABS tablet Take 1 tablet by mouth daily.  . nitrofurantoin, macrocrystal-monohydrate, (MACROBID) 100 MG capsule Take 1 capsule (100 mg total) by mouth 2 (two) times daily. Take with food.  . sertraline (ZOLOFT) 100 MG tablet Take 100 mg by mouth daily.     Allergies:   Patient has no known allergies.   Social History   Socioeconomic History  . Marital status: Married    Spouse name: Not  on file  . Number of children: Not on file  . Years of education: Not on file  . Highest education level: Not on file  Occupational History  . Not on file  Social Needs  . Financial resource strain: Not on file  . Food insecurity:    Worry: Not on file    Inability: Not on file  . Transportation needs:    Medical: Not on file    Non-medical: Not on file  Tobacco Use  . Smoking status: Never Smoker  . Smokeless tobacco: Never Used  Substance and Sexual Activity  . Alcohol use: Yes  . Drug use: No  . Sexual activity: Yes    Birth control/protection: Other-see comments    Comment: vasectomy  Lifestyle  . Physical  activity:    Days per week: Not on file    Minutes per session: Not on file  . Stress: Not on file  Relationships  . Social connections:    Talks on phone: Not on file    Gets together: Not on file    Attends religious service: Not on file    Active member of club or organization: Not on file    Attends meetings of clubs or organizations: Not on file    Relationship status: Not on file  Other Topics Concern  . Not on file  Social History Narrative  . Not on file     Family History: The patient's family history includes Anxiety disorder in her brother and father; Cancer in her cousin; Depression in her brother and father; Heart attack in her maternal grandfather and paternal grandmother; Hypertension in her father and mother; Multiple sclerosis in her paternal aunt.  ROS:   Please see the history of present illness.    All other systems reviewed and are negative.  EKGs/Labs/Other Studies Reviewed:    The following studies were reviewed today: I reviewed EKG from hospital records and also lab work and hospital emergency room visit and discussed with the patient.   Recent Labs: 06/21/2018: BUN 13; Creatinine, Ser 0.78; Hemoglobin 14.1; Platelets 210; Potassium 3.9; Sodium 141  Recent Lipid Panel No results found for: CHOL, TRIG, HDL, CHOLHDL, VLDL, LDLCALC, LDLDIRECT  Physical Exam:    VS:  BP 112/62 (BP Location: Right Arm, Patient Position: Sitting, Cuff Size: Normal)   Pulse 62   Ht 5\' 3"  (1.6 m)   Wt 127 lb (57.6 kg)   LMP 06/07/2018   SpO2 99%   BMI 22.50 kg/m     Wt Readings from Last 3 Encounters:  06/28/18 127 lb (57.6 kg)  03/25/17 132 lb (59.9 kg)  10/09/16 135 lb (61.2 kg)     GEN: Patient is in no acute distress HEENT: Normal NECK: No JVD; No carotid bruits LYMPHATICS: No lymphadenopathy CARDIAC: S1 S2 regular, 2/6 systolic murmur at the apex. RESPIRATORY:  Clear to auscultation without rales, wheezing or rhonchi  ABDOMEN: Soft, non-tender,  non-distended MUSCULOSKELETAL:  No edema; No deformity  SKIN: Warm and dry NEUROLOGIC:  Alert and oriented x 3 PSYCHIATRIC:  Normal affect    Signed, Garwin Brothers, MD  06/28/2018 10:04 AM    [facility]

## 2018-06-29 ENCOUNTER — Telehealth: Payer: Self-pay

## 2018-06-29 LAB — LIPID PANEL
Chol/HDL Ratio: 1.9 ratio (ref 0.0–4.4)
Cholesterol, Total: 155 mg/dL (ref 100–199)
HDL: 80 mg/dL (ref >39)
LDL Calculated: 66 mg/dL (ref 0–99)
TRIGLYCERIDES: 44 mg/dL (ref 0–149)
VLDL CHOLESTEROL CAL: 9 mg/dL (ref 5–40)

## 2018-06-29 LAB — TSH: TSH: 2.27 u[IU]/mL (ref 0.450–4.500)

## 2018-06-29 LAB — MAGNESIUM: MAGNESIUM: 2 mg/dL (ref 1.6–2.3)

## 2018-06-29 NOTE — Telephone Encounter (Signed)
Patient called and notified of results.  °

## 2018-06-29 NOTE — Telephone Encounter (Signed)
-----   Message from Rajan R Revankar, MD sent at 06/29/2018  9:04 AM EDT ----- The results of the study is unremarkable. Please inform patient. I will discuss in detail at next appointment. Cc  primary care/referring physician Rajan R Revankar, MD 06/29/2018 9:04 AM 

## 2018-07-04 ENCOUNTER — Ambulatory Visit (INDEPENDENT_AMBULATORY_CARE_PROVIDER_SITE_OTHER): Payer: 59

## 2018-07-04 ENCOUNTER — Ambulatory Visit (HOSPITAL_COMMUNITY): Payer: 59 | Attending: Cardiology

## 2018-07-04 ENCOUNTER — Ambulatory Visit: Payer: 59

## 2018-07-04 DIAGNOSIS — R079 Chest pain, unspecified: Secondary | ICD-10-CM | POA: Diagnosis not present

## 2018-07-04 DIAGNOSIS — R42 Dizziness and giddiness: Secondary | ICD-10-CM

## 2018-07-04 DIAGNOSIS — Z8249 Family history of ischemic heart disease and other diseases of the circulatory system: Secondary | ICD-10-CM | POA: Diagnosis not present

## 2018-07-04 LAB — EXERCISE TOLERANCE TEST
CHL RATE OF PERCEIVED EXERTION: 14
CSEPED: 15 min
CSEPEDS: 0 s
CSEPEW: 17.5 METS
CSEPHR: 99 %
MPHR: 188 {beats}/min
Peak HR: 187 {beats}/min
Rest HR: 72 {beats}/min

## 2018-07-18 ENCOUNTER — Ambulatory Visit: Payer: 59

## 2018-11-15 ENCOUNTER — Other Ambulatory Visit: Payer: Self-pay

## 2018-11-15 ENCOUNTER — Emergency Department (HOSPITAL_BASED_OUTPATIENT_CLINIC_OR_DEPARTMENT_OTHER)
Admission: EM | Admit: 2018-11-15 | Discharge: 2018-11-15 | Disposition: A | Discharge status: Home / Self Care | Payer: 59 | Attending: Emergency Medicine | Admitting: Emergency Medicine

## 2018-11-15 ENCOUNTER — Encounter (HOSPITAL_BASED_OUTPATIENT_CLINIC_OR_DEPARTMENT_OTHER): Payer: Self-pay

## 2018-11-15 DIAGNOSIS — S060X0A Concussion without loss of consciousness, initial encounter: Secondary | ICD-10-CM

## 2018-11-15 DIAGNOSIS — Y998 Other external cause status: Secondary | ICD-10-CM | POA: Diagnosis not present

## 2018-11-15 DIAGNOSIS — Y92410 Unspecified street and highway as the place of occurrence of the external cause: Secondary | ICD-10-CM | POA: Insufficient documentation

## 2018-11-15 DIAGNOSIS — Z79899 Other long term (current) drug therapy: Secondary | ICD-10-CM | POA: Insufficient documentation

## 2018-11-15 DIAGNOSIS — Y9389 Activity, other specified: Secondary | ICD-10-CM | POA: Diagnosis not present

## 2018-11-15 DIAGNOSIS — F419 Anxiety disorder, unspecified: Secondary | ICD-10-CM | POA: Diagnosis not present

## 2018-11-15 DIAGNOSIS — S0990XA Unspecified injury of head, initial encounter: Secondary | ICD-10-CM | POA: Diagnosis present

## 2018-11-15 HISTORY — DX: Concussion with loss of consciousness status unknown, initial encounter: S06.0XAA

## 2018-11-15 HISTORY — DX: Concussion with loss of consciousness of unspecified duration, initial encounter: S06.0X9A

## 2018-11-15 NOTE — Discharge Instructions (Signed)
So it will be important for you to exercise brain rest (no eyestrain with reading or computers and no intense activity) needs to continue until you are cleared by your neurologist
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
## 2019-09-26 IMAGING — DX DG CHEST 2V
2 series · 2 of 2 positions shown · non-contrast
Comparison: None.

CLINICAL DATA: Dizziness and lightheadedness.

EXAM:
CHEST - 2 VIEW

[chest pa]
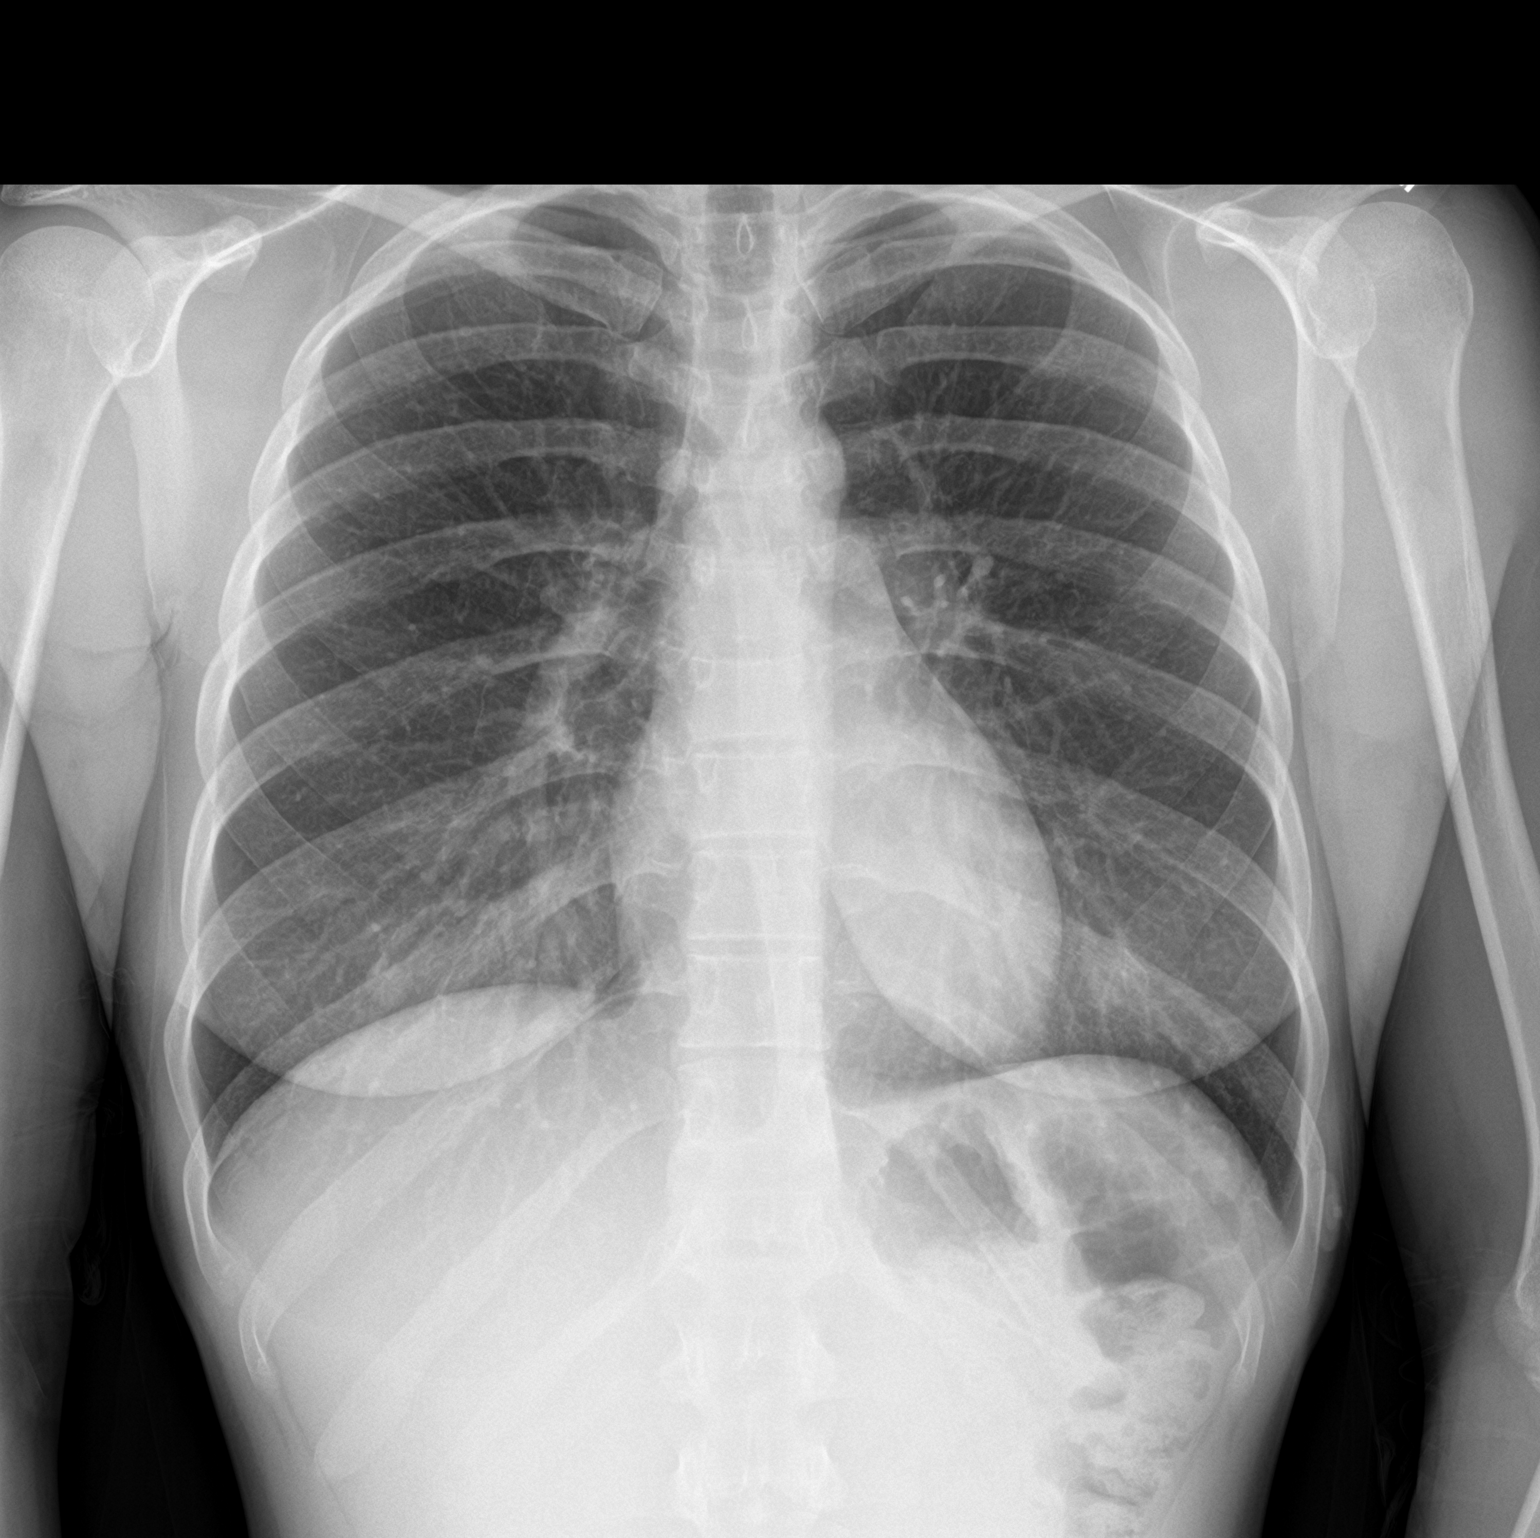

[chest lat]
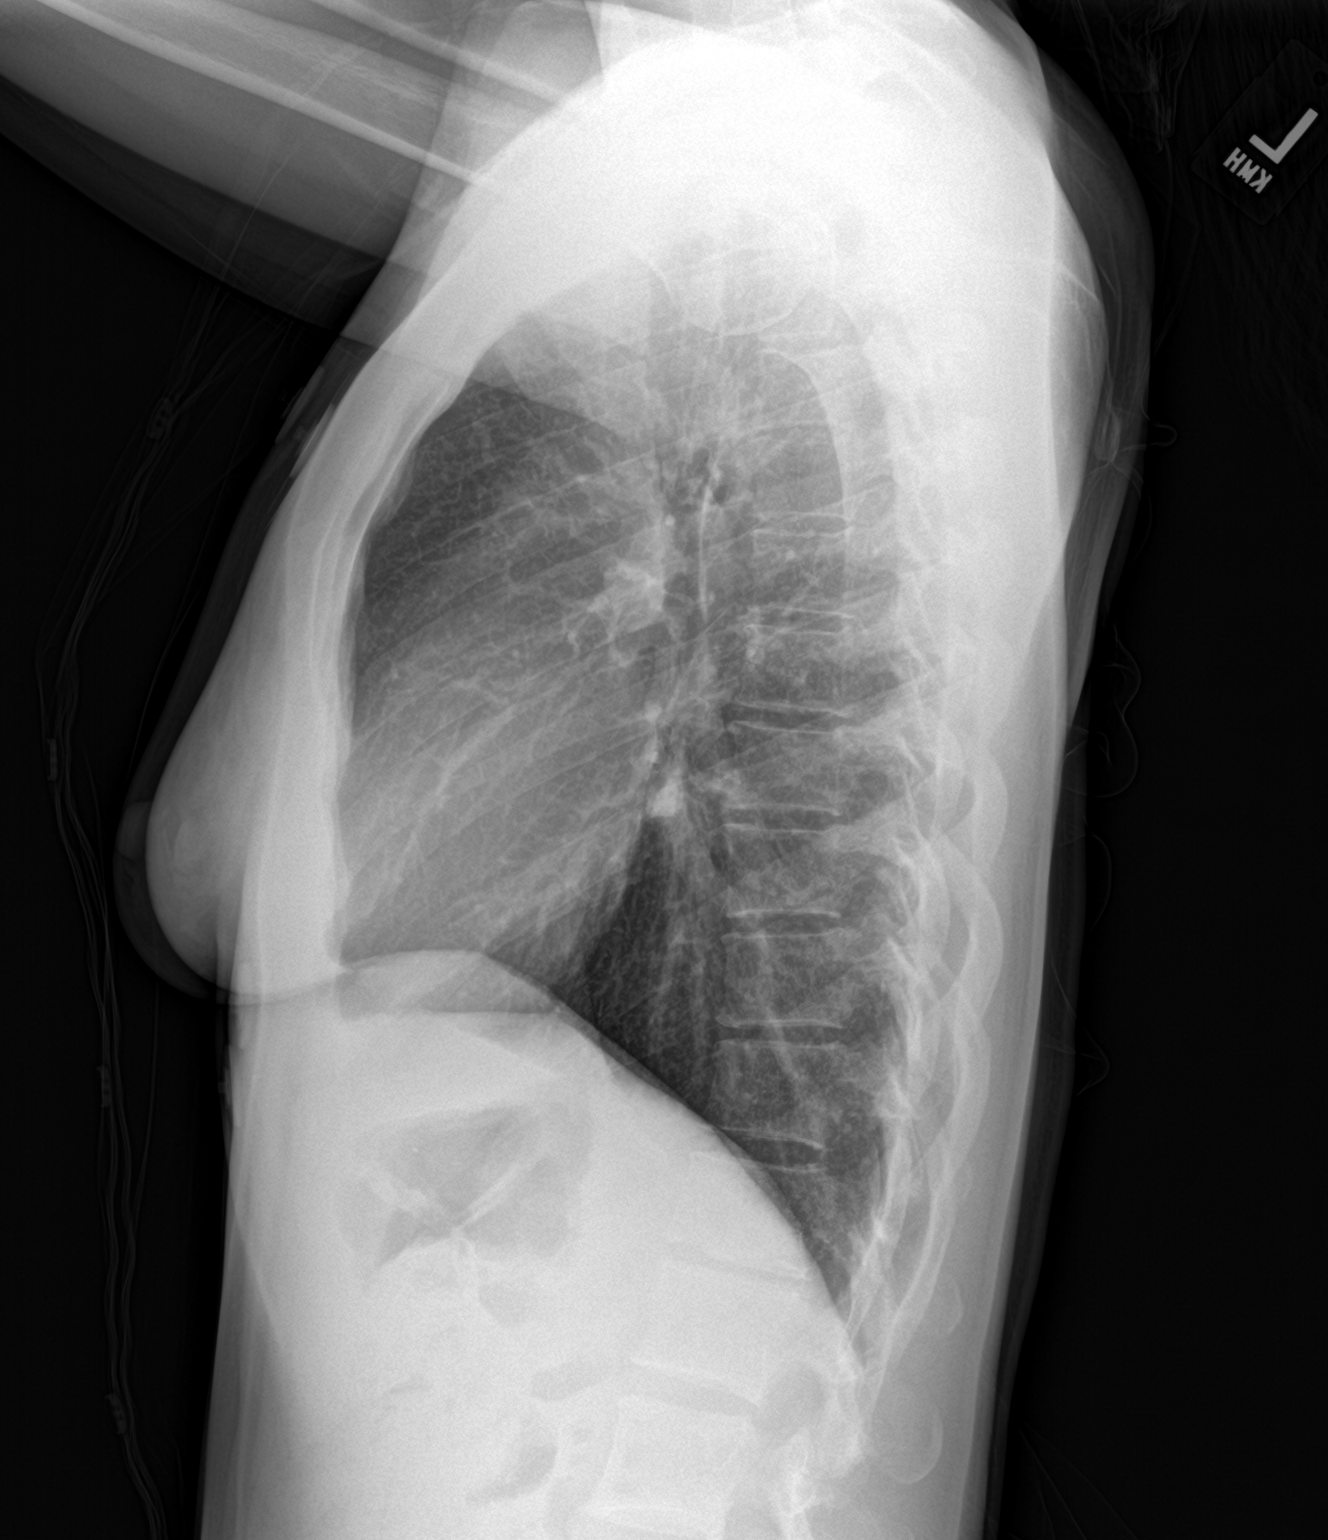

[2 of 2 positions shown; findings below may reference images not displayed]

FINDINGS: The heart size and mediastinal contours are within normal limits.
Both lungs are clear. The visualized skeletal structures are
unremarkable.
IMPRESSION: Normal chest.
